# Patient Record
Sex: Male | Born: 1970 | Hispanic: Yes | Marital: Married | Smoking: Current every day smoker
Health system: Southern US, Community
[De-identification: ages and names within clinical notes are randomized; demographics above are authoritative.]

---

## 2012-03-27 ENCOUNTER — Emergency Department (HOSPITAL_BASED_OUTPATIENT_CLINIC_OR_DEPARTMENT_OTHER)
Admission: EM | Admit: 2012-03-27 | Discharge: 2012-03-27 | Disposition: A | Discharge status: Home / Self Care | Payer: Self-pay | Attending: Emergency Medicine | Admitting: Emergency Medicine

## 2012-03-27 ENCOUNTER — Encounter (HOSPITAL_BASED_OUTPATIENT_CLINIC_OR_DEPARTMENT_OTHER): Payer: Self-pay | Admitting: Emergency Medicine

## 2012-03-27 DIAGNOSIS — T672XXA Heat cramp, initial encounter: Secondary | ICD-10-CM

## 2012-03-27 DIAGNOSIS — Z880 Allergy status to penicillin: Secondary | ICD-10-CM | POA: Insufficient documentation

## 2012-03-27 DIAGNOSIS — X30XXXA Exposure to excessive natural heat, initial encounter: Secondary | ICD-10-CM | POA: Insufficient documentation

## 2012-03-27 DIAGNOSIS — F17200 Nicotine dependence, unspecified, uncomplicated: Secondary | ICD-10-CM | POA: Insufficient documentation

## 2012-03-27 DIAGNOSIS — T675XXA Heat exhaustion, unspecified, initial encounter: Secondary | ICD-10-CM | POA: Insufficient documentation

## 2012-03-27 DIAGNOSIS — E860 Dehydration: Secondary | ICD-10-CM | POA: Insufficient documentation

## 2012-03-27 LAB — BASIC METABOLIC PANEL
BUN: 33 mg/dL — ABNORMAL HIGH (ref 6–23)
CO2: 21 mEq/L (ref 19–32)
Chloride: 96 mEq/L (ref 96–112)
Creatinine, Ser: 1.7 mg/dL — ABNORMAL HIGH (ref 0.50–1.35)
Glucose, Bld: 114 mg/dL — ABNORMAL HIGH (ref 70–99)
Potassium: 3.4 mEq/L — ABNORMAL LOW (ref 3.5–5.1)

## 2012-03-27 LAB — URINALYSIS, ROUTINE W REFLEX MICROSCOPIC
Glucose, UA: NEGATIVE mg/dL
Ketones, ur: 15 mg/dL — AB
Specific Gravity, Urine: 1.024 (ref 1.005–1.030)
pH: 5 (ref 5.0–8.0)

## 2012-03-27 LAB — CBC WITH DIFFERENTIAL/PLATELET
Basophils Relative: 0 % (ref 0–1)
Eosinophils Relative: 1 % (ref 0–5)
HCT: 45.9 % (ref 39.0–52.0)
Hemoglobin: 17.3 g/dL — ABNORMAL HIGH (ref 13.0–17.0)
Lymphs Abs: 1.8 10*3/uL (ref 0.7–4.0)
MCV: 82.6 fL (ref 78.0–100.0)
Monocytes Relative: 7 % (ref 3–12)
Neutro Abs: 10 10*3/uL — ABNORMAL HIGH (ref 1.7–7.7)
RBC: 5.56 MIL/uL (ref 4.22–5.81)
RDW: 12.5 % (ref 11.5–15.5)
WBC: 12.8 10*3/uL — ABNORMAL HIGH (ref 4.0–10.5)

## 2012-03-27 LAB — URINE MICROSCOPIC-ADD ON

## 2012-03-27 NOTE — ED Notes (Signed)
Pt "started feeling bad" about an hour ago while at work. Works outside.  C/O muscle cramps. Coworkers poured rubbing alcohol on him.  Has had about 6 bottles of water today.

## 2012-04-03 NOTE — ED Provider Notes (Signed)
History     CSN: [id number]  Arrival date & time 03/27/12  9528   First MD Initiated Contact with Patient 03/27/12 1913      Chief Complaint  Patient presents with  . Spasms    (Consider location/radiation/quality/duration/timing/severity/associated sxs/prior treatment) HPIWalter Love is a very 41 y.o. male who is currently working as a Music therapist. Patient says he was working outside today and heat and had the gradual onset of severe muscle cramps. Muscle cramps are located in his arms and legs, they were 10 out of 10 when he first presented, they're crampy pain, does not radiate, he had no associated dizziness, lightheadedness, chest pain, shortness of breath. He had his coworkers purport rubbing alcohol on him without any relief. He has had about 6 bottles of water today. He denies drinking any alcohol today or last night.  History reviewed. No pertinent past medical history.  History reviewed. No pertinent past surgical history.  No family history on file.  History  Substance Use Topics  . Smoking status: Current Every Day Smoker -- 0.5 packs/day  . Smokeless tobacco: Not on file  . Alcohol Use: No      Review of Systems At least 10pt or greater review of systems completed and are negative except where specified in the HPI.  Allergies  Penicillins  Home Medications  No current outpatient prescriptions on file.  BP 157/102  Pulse 76  Temp 97.8 F (36.6 C) (Rectal)  Resp 18  Ht 5\' 5"  (1.651 m)  Wt 170 lb (77.111 kg)  BMI 28.29 kg/m2  SpO2 98%  Physical Exam  Nursing notes reviewed.  Electronic medical record reviewed. VITAL SIGNS:   Filed Vitals:   03/27/12 1810 03/27/12 1812 03/27/12 1957  BP:  157/102   Pulse:  76   Temp:  98.1 F (36.7 C) 97.8 F (36.6 C)  TempSrc:  Oral Rectal  Resp:  18   Height:  5\' 5"  (1.651 m)   Weight:  170 lb (77.111 kg)   SpO2: 97% 98%    CONSTITUTIONAL: Awake, oriented, appears non-toxic HENT: Atraumatic,  normocephalic, oral mucosa pink and moist, airway patent. Nares patent without drainage. External ears normal. EYES: Conjunctiva clear, EOMI, PERRLA NECK: Trachea midline, non-tender, supple CARDIOVASCULAR: Normal heart rate, Normal rhythm, No murmurs, rubs, gallops PULMONARY/CHEST: Clear to auscultation, no rhonchi, wheezes, or rales. Symmetrical breath sounds. Non-tender. ABDOMINAL: Non-distended, soft, non-tender - no rebound or guarding.  BS normal. NEUROLOGIC: Non-focal, moving all four extremities, no gross sensory or motor deficits. EXTREMITIES: No clubbing, cyanosis, or edema SKIN: Warm, Dry, No erythema, No rash  ED Course  Procedures (including critical care time)  Labs Reviewed  CBC WITH DIFFERENTIAL - Abnormal; Notable for the following:    WBC 12.8 (*)     Hemoglobin 17.3 (*)     MCHC 37.7 (*)     Neutrophils Relative 78 (*)     Neutro Abs 10.0 (*)     All other components within normal limits  BASIC METABOLIC PANEL - Abnormal; Notable for the following:    Sodium 134 (*)     Potassium 3.4 (*)     Glucose, Bld 114 (*)     BUN 33 (*)     Creatinine, Ser 1.70 (*)     Calcium 10.8 (*)     GFR calc non Af Amer 49 (*)     GFR calc Af Amer 56 (*)     All other components within normal limits  URINALYSIS, ROUTINE W  REFLEX MICROSCOPIC - Abnormal; Notable for the following:    Color, Urine AMBER (*)  BIOCHEMICALS MAY BE AFFECTED BY COLOR   APPearance CLOUDY (*)     Hgb urine dipstick TRACE (*)     Bilirubin Urine SMALL (*)     Ketones, ur 15 (*)     Protein, ur 100 (*)     All other components within normal limits  URINE MICROSCOPIC-ADD ON - Abnormal; Notable for the following:    Squamous Epithelial / LPF FEW (*)     All other components within normal limits   No results found.   1. Heat exhaustion   2. Cramp, heat     MDM  Harry Love is a 41 y.o. male presenting with some mild heat cramps/heat exhaustion. Do not think the patient is at risk for  rhabdomyolysis - his urine showed a trace amount of hemoglobin on the dipstick however it also showed 0-2 red blood cells per high-power field. Because there were red blood cells seen I am not concerned with myoglobin extracellularly. Patient responded very well to intravenous fluids and feels much better at this point. He's asking to leave and go get something to eat. We'll finish out his last active IV fluids and discharged home in good condition. Patient is given extensive instructions to maintain hydration working outside especially when it is hot out. Patient understands and acknowledges instructions given and will followup as needed.  I think his laboratory results reflect mild dehydration and heat exhaustion with an elevated BUN and creatinine, 33 and 1.7 respectively, do not think his elevation in creatinine represents an intrinsic renal failure rather a prerenal condition.  I explained the diagnosis and have given explicit precautions to return to the ER including return of cramps, chest pain, shortness of breath, dizziness or any other new or worsening symptoms. The patient understands and accepts the medical plan as it's been dictated and I have answered their questions. Discharge instructions concerning home care and prescriptions have been given.  The patient is STABLE and is discharged to home in good condition.          Jones Skene, MD 04/03/12 1408

## 2013-12-05 ENCOUNTER — Ambulatory Visit: Payer: Self-pay

## 2014-06-03 ENCOUNTER — Emergency Department (HOSPITAL_COMMUNITY)
Admission: EM | Admit: 2014-06-03 | Discharge: 2014-06-03 | Disposition: A | Discharge status: Home / Self Care | Payer: Self-pay | Attending: Emergency Medicine | Admitting: Emergency Medicine

## 2014-06-03 ENCOUNTER — Emergency Department (HOSPITAL_COMMUNITY): Payer: Self-pay

## 2014-06-03 ENCOUNTER — Encounter (HOSPITAL_COMMUNITY): Payer: Self-pay | Admitting: *Deleted

## 2014-06-03 DIAGNOSIS — Z720 Tobacco use: Secondary | ICD-10-CM | POA: Insufficient documentation

## 2014-06-03 DIAGNOSIS — M25569 Pain in unspecified knee: Secondary | ICD-10-CM

## 2014-06-03 DIAGNOSIS — Z880 Allergy status to penicillin: Secondary | ICD-10-CM | POA: Insufficient documentation

## 2014-06-03 DIAGNOSIS — M25562 Pain in left knee: Secondary | ICD-10-CM | POA: Insufficient documentation

## 2014-06-03 MED ORDER — OXYCODONE-ACETAMINOPHEN 5-325 MG PO TABS
2.0000 | ORAL_TABLET | Freq: Once | ORAL | Status: AC
  Administered 2014-06-03: 2 via ORAL
  Filled 2014-06-03: qty 2

## 2014-06-03 MED ORDER — OXYCODONE-ACETAMINOPHEN 5-325 MG PO TABS: 2.0000 | ORAL_TABLET | Freq: Four times a day (QID) | ORAL | Status: AC | PRN

## 2014-06-03 NOTE — Discharge Instructions (Signed)

## 2014-06-03 NOTE — ED Provider Notes (Signed)
CSN: [id number]     Arrival date & time 06/03/14  1139 History  This chart was scribed for non-physician practitioner, Roxy Horsemanobert Junko Ohagan, PA-C working with Vanetta MuldersScott Zackowski, MD by Greggory StallionKayla Andersen, ED scribe. This patient was seen in room TR05C/TR05C and the patient's care was started at 12:45 PM.   Chief Complaint  Patient presents with  . Knee Pain   The history is provided by the patient. No language interpreter was used.    HPI Comments: Harry Love is a 43 y.o. male who presents to the Emergency Department complaining of left knee pain with associated swelling that started 3 days ago. Denies fall or injury. Pt states he gets similar pain every month but has never been evaluated for it. Bending his knee worsens pain. He has not taken anything yet. Pt does not have an orthopedist.   History reviewed. No pertinent past medical history. History reviewed. No pertinent past surgical history. History reviewed. No pertinent family history. History  Substance Use Topics  . Smoking status: Current Every Day Smoker -- 0.50 packs/day  . Smokeless tobacco: Not on file  . Alcohol Use: No    Review of Systems  Constitutional: Negative for fever.  HENT: Negative for congestion.   Eyes: Negative for redness.  Respiratory: Negative for shortness of breath.   Cardiovascular: Negative for chest pain.  Gastrointestinal: Negative for abdominal distention.  Musculoskeletal: Positive for joint swelling and arthralgias.  Skin: Negative for rash.  Neurological: Negative for speech difficulty.  Psychiatric/Behavioral: Negative for confusion.   Allergies  Penicillins  Home Medications   Prior to Admission medications   Not on File   BP 159/96 mmHg  Pulse 79  Temp(Src) 98 F (36.7 C) (Oral)  Resp 18  SpO2 97%   Physical Exam  Constitutional: He is oriented to person, place, and time. He appears well-developed. No distress.  HENT:  Head: Normocephalic and atraumatic.  Eyes: Conjunctivae  and EOM are normal.  Cardiovascular: Normal rate and regular rhythm.   Pulmonary/Chest: Effort normal. No stridor. No respiratory distress.  Abdominal: He exhibits no distension.  Musculoskeletal:  Left knee moderately swollen and tender to palpation over medial and lateral aspects. ROM limited secondary to pain. Pt is able to ambulate. No bony abnormality or deformity.   Neurological: He is alert and oriented to person, place, and time.  Skin: Skin is warm and dry.  Psychiatric: He has a normal mood and affect.  Nursing note and vitals reviewed.   ED Course  Procedures (including critical care time)  DIAGNOSTIC STUDIES: Oxygen Saturation is 97% on RA, normal by my interpretation.    COORDINATION OF CARE: 12:47 PM-Discussed treatment plan which includes knee xray with pt at bedside and pt agreed to plan.  2:00 PM-Consulted with Dr. Deretha EmoryZackowski. As there is no mechanism of injury, we feel orthopedic follow up is appropriate.   Labs Review Labs Reviewed - No data to display  Imaging Review Dg Knee Complete 4 Views Left  06/03/2014   CLINICAL DATA:  43 year old male with acute on chronic knee pain. Lateral and superior pain with swelling. No known injury. Initial encounter.  EXAM: LEFT KNEE - COMPLETE 4+ VIEW  COMPARISON:  None.  FINDINGS: Discontinuity of the superolateral patella suggesting bipartite patella. However, there is irregular spurring or perhaps mild periosteal reaction also at that level. Moderate to large suprapatellar joint effusion also in the region.  Elsewhere bone mineralization is normal. Mild medial and lateral compartment degenerative spurring. No acute fracture identified.  IMPRESSION:  1. 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 Query underlying quadriceps tendinopathy. 2. Moderate to large joint effusion.   Electronically Signed   By: Augusto GambleLee  Hall M.D.   On:  06/03/2014 13:48     EKG Interpretation None      MDM   Final diagnoses:  Knee pain    Patient with knee pain.  States that this is a chronic problem for him.  Will recommend ortho follow-up.  Discussed with Dr. Deretha EmoryZackowski.  No MOI.  I personally performed the services described in this documentation, which was scribed in my presence. The recorded information has been reviewed and is accurate.  Roxy Horsemanobert Shauni Henner, PA-C 06/03/14 1412  Vanetta MuldersScott Zackowski, MD 06/04/14 [id number]

## 2014-06-03 NOTE — ED Notes (Signed)
Pt reports left knee pain and swelling x 4 days. Denies injury to knee, ambulatory at triage.

## 2014-11-30 ENCOUNTER — Encounter (HOSPITAL_COMMUNITY): Payer: Self-pay | Admitting: Emergency Medicine

## 2014-11-30 ENCOUNTER — Emergency Department (HOSPITAL_COMMUNITY): Payer: Self-pay

## 2014-11-30 ENCOUNTER — Emergency Department (HOSPITAL_COMMUNITY)
Admission: EM | Admit: 2014-11-30 | Discharge: 2014-11-30 | Disposition: A | Discharge status: Home / Self Care | Payer: Self-pay | Attending: Emergency Medicine | Admitting: Emergency Medicine

## 2014-11-30 DIAGNOSIS — Z720 Tobacco use: Secondary | ICD-10-CM | POA: Insufficient documentation

## 2014-11-30 DIAGNOSIS — T1592XA Foreign body on external eye, part unspecified, left eye, initial encounter: Secondary | ICD-10-CM

## 2014-11-30 DIAGNOSIS — Z880 Allergy status to penicillin: Secondary | ICD-10-CM | POA: Insufficient documentation

## 2014-11-30 DIAGNOSIS — W228XXA Striking against or struck by other objects, initial encounter: Secondary | ICD-10-CM | POA: Insufficient documentation

## 2014-11-30 DIAGNOSIS — M25462 Effusion, left knee: Secondary | ICD-10-CM | POA: Insufficient documentation

## 2014-11-30 DIAGNOSIS — Y9389 Activity, other specified: Secondary | ICD-10-CM | POA: Insufficient documentation

## 2014-11-30 DIAGNOSIS — Y9289 Other specified places as the place of occurrence of the external cause: Secondary | ICD-10-CM | POA: Insufficient documentation

## 2014-11-30 DIAGNOSIS — T1512XA Foreign body in conjunctival sac, left eye, initial encounter: Secondary | ICD-10-CM | POA: Insufficient documentation

## 2014-11-30 DIAGNOSIS — Z23 Encounter for immunization: Secondary | ICD-10-CM | POA: Insufficient documentation

## 2014-11-30 DIAGNOSIS — Y990 Civilian activity done for income or pay: Secondary | ICD-10-CM | POA: Insufficient documentation

## 2014-11-30 MED ORDER — TETRACAINE HCL 0.5 % OP SOLN
1.0000 [drp] | Freq: Once | OPHTHALMIC | Status: AC
  Administered 2014-11-30: 1 [drp] via OPHTHALMIC
  Filled 2014-11-30: qty 2

## 2014-11-30 MED ORDER — FLUORESCEIN SODIUM 1 MG OP STRP
1.0000 | ORAL_STRIP | Freq: Once | OPHTHALMIC | Status: AC
  Administered 2014-11-30: 1 via OPHTHALMIC
  Filled 2014-11-30: qty 1

## 2014-11-30 MED ORDER — TETANUS-DIPHTH-ACELL PERTUSSIS 5-2.5-18.5 LF-MCG/0.5 IM SUSP
0.5000 mL | Freq: Once | INTRAMUSCULAR | Status: AC
  Administered 2014-11-30: 0.5 mL via INTRAMUSCULAR
  Filled 2014-11-30: qty 0.5

## 2014-11-30 NOTE — ED Notes (Signed)
Declined W/C at D/C and was escorted to lobby by RN. 

## 2014-11-30 NOTE — ED Notes (Signed)
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 C/o pain left eye.

## 2014-11-30 NOTE — Discharge Instructions (Signed)
Go to the [facility] now [address] [place]  [phone]  [phone]  Eye, Foreign Body The term foreign body refers to any object near, on the surface of or in the eye that should not be there. A foreign body may be a small speck of dirt or dust, a hair or eyelash, a splinter or any object. CAUSES  Foreign bodies can get in the eye by:  Flying pieces of something that was broken or destroyed (debris).  A sudden injury (trauma) to the eye. SYMPTOMS  Symptoms depend on what the foreign body is and where it is in the eye. The most common locations are:  On the inner surface of the upper or lower eyelids or on the covering of the white part of the eye (conjunctiva). Symptoms in this location are:  Irritating and painful, especially when blinking.  Feeling like something is in the eye.  On the surface of the clear covering on the front of the eye (cornea). A corneal foreign body has symptoms that:  Are painful and irritating since the cornea is very sensitive.  Form small "rust rings" around a metallic foreign body. Metallic foreign bodies stick more firmly to the surface of the cornea.  Inside the eyeball. Infection can happen fast and can be hard to treat with antibiotics. This is an extremely dangerous situation. Foreign bodies inside the eye can threaten vision. A person may even loose their eye. Foreign bodies inside the eye may cause:  Great pain.  Immediate loss of vision. DIAGNOSIS  Foreign bodies are found during an exam by an eye specialist. Those that are on the eyelids, conjunctiva or cornea are usually (but not always) easily found. When a foreign body is inside the eyeball, a cataract may form almost right away. This makes it hard for an ophthalmologist to find the foreign body. Special tests may be needed, including ultrasound testing, X-rays and CT scans. TREATMENT   Foreign bodies that are on the eyelids, conjunctiva or cornea are often  removed easily and painlessly.  If the foreign body has caused a scratch or abrasion of the cornea, antibiotic drops, ointments and/or a tight patch called a "pressure patch" may be needed. Follow-up exams will be needed for several days until the abrasion heals.  Surgery is needed right away if the foreign body is inside the eyeball. This is a medical emergency. An antibiotic therapy will likely be given to stop an infection. HOME CARE INSTRUCTIONS  The use of eye patches is not universal. Their use varies from state to state and from caregiver to caregiver. If an eye patch was applied:  Keep the eye patch on for as long as directed by your caregiver until the follow-up appointment.  Do not remove the patch to put in medications unless instructed to do so. When replacing the patch, retape it as it was before. Follow the same procedure if the patch becomes loose.  WARNING: Do not drive or operate machinery while the eye is patched. The ability to judge distances will be impaired.  Only take over-the-counter or prescription medicines for pain, discomfort or fever as directed by the caregiver. If no eye patch was applied:  Keep the eye closed as much as possible. Do not rub the eye.  Wear dark glasses as needed to protect the eyes from bright light.  Do not wear contact lenses until the eye feels normal again, or as instructed.  Wear protective eye covering if there is  a risk of eye injury. This is important when working with high speed tools.  Only take over-the-counter or prescription medicines for pain, discomfort or fever as directed by the caregiver. SEEK IMMEDIATE MEDICAL CARE IF:   Pain increases in the eye or the vision changes.  You or your child has problems with the eye patch.  The injury to the eye appears to be getting larger.  There is discharge from the injured eye.  Swelling and/or soreness (inflammation) develops around the affected eye.  You or your child has an  oral temperature above 102 F (38.9 C), not controlled by medicine.  Your baby is older than 3 months with a rectal temperature of 102 F (38.9 C) or higher.  Your baby is 86 months old or younger with a rectal temperature of 100.4 F (38 C) or higher. MAKE SURE YOU:   Understand these instructions.  Will watch your condition.  Will get help right away if you are not doing well or get worse. Document Released: 06/12/2005 Document Revised: 09/04/2011 Document Reviewed: 11/07/2012 [facility] Patient Information 2015 [place], [place]. This information is not intended to replace advice given to you by your health care provider. Make sure you discuss any questions you have with your health care provider.

## 2014-11-30 NOTE — ED Provider Notes (Signed)
CSN: [id number]     Arrival date & time 11/30/14  1307 History  This chart was scribed for Harry Buffalo, NP working with Linwood Dibbles, MD by Evon Slack, ED Scribe. This patient was seen in room TR04C/TR04C and the patient's care was started at 1:54 PM.    Chief Complaint  Patient presents with  . Eye Injury  . Knee Pain   Patient is a 44 y.o. male presenting with eye injury and knee pain. The history is provided by the patient. No language interpreter was used.  Eye Injury This is a new problem. The current episode started 6 to 12 hours ago. The problem occurs rarely. The problem has not changed since onset.Exacerbated by: blinking  Nothing relieves the symptoms. He has tried water for the symptoms.  Knee Pain Location:  Knee Time since incident:  2 days Knee location:  L knee Pain details:    Radiates to:  Does not radiate   Severity:  Mild   Onset quality:  Gradual   Duration:  2 days Relieved by:  None tried Worsened by:  Nothing tried Ineffective treatments:  None tried Associated symptoms: swelling   Associated symptoms: no numbness and no tingling    HPI Comments: Harry Love is a 44 y.o. male who presents to the Emergency Department complaining of left eye pain onset this morning. 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 Pt states that he was not wearing safety glasses. Pt states that he has tried rinsing the eye out with water with no relief. Pt states that the pain is worse when blinking.    Pt is also complaining of left knee pain with associated swelling for the past 2 days. pt denies injury  or fall. Pt states that the pain is worse with movement.   History reviewed. No pertinent past medical history. History reviewed. No pertinent past surgical history. No family history on file. History  Substance Use Topics  . Smoking status: Current Every Day Smoker -- 0.50 packs/day  . Smokeless tobacco: Not on file  .  Alcohol Use: No    Review of Systems  Eyes: Positive for pain.  Musculoskeletal: Positive for arthralgias.  Neurological: Negative for numbness.  All other systems reviewed and are negative.     Allergies  Penicillins  Home Medications   Prior to Admission medications   Medication Sig Start Date End Date Taking? Authorizing Provider  acetaminophen (TYLENOL) 325 MG tablet Take 650 mg by mouth every 6 (six) hours as needed for mild pain.   Yes Historical Provider, MD  oxyCODONE-acetaminophen (PERCOCET/ROXICET) 5-325 MG per tablet Take 2 tablets by mouth every 6 (six) hours as needed for severe pain. Patient not taking: Reported on 11/30/2014 06/03/14   Roxy Horseman, PA-C   BP 147/93 mmHg  Pulse 56  Temp(Src) 98 F (36.7 C) (Oral)  Resp 16  SpO2 98%   Physical Exam  Constitutional: He is oriented to person, place, and time. He appears well-developed and well-nourished. No distress.  HENT:  Head: Normocephalic and atraumatic.  Eyes: EOM are normal. Pupils are equal, round, and reactive to light. Left conjunctiva is injected.  Slit lamp exam:      The left eye shows foreign body. The left eye shows no corneal abrasion.    Foreign body left eye @ 11 o'clock  Neck: Neck supple. No tracheal deviation present.  Cardiovascular: Normal rate.   Pulmonary/Chest: Effort normal. No  respiratory distress.  Musculoskeletal: Normal range of motion. He exhibits tenderness.  Tenderness over medial and collateral ligament of left knee. Pedal pulses equal, adequate circulation.   Neurological: He is alert and oriented to person, place, and time.  Skin: Skin is warm and dry.  Psychiatric: He has a normal mood and affect. His behavior is normal.  Nursing note and vitals reviewed.   ED Course  Procedures (including critical care time) DIAGNOSTIC STUDIES: Oxygen Saturation is 100% on RA, normal by my interpretation.    COORDINATION OF CARE: 2:02 PM-Discussed treatment plan with pt at  bedside and pt agreed to plan.   Consult with opthalmology on call and they will see the patient in the office as soon as he is d/c from the ED.    Labs Review Labs Reviewed - No data to display  Imaging Review Dg Knee Complete 4 Views Left  11/30/2014   CLINICAL DATA:  Chronic left knee pain and swelling, worse over the last 2 days.  EXAM: LEFT KNEE - COMPLETE 4+ VIEW  COMPARISON:  11/19/2014  FINDINGS: No acute fracture or dislocation is identified. There is a new moderate sized knee joint effusion. Bipartite patella is again noted with superior patellar spurring. Very mild tricompartmental marginal osteophytosis is unchanged. No significant joint space narrowing is present.  IMPRESSION: New knee joint effusion.  No acute osseous abnormality.   Electronically Signed   By: Sebastian AcheAllen  Grady   On: 11/30/2014 14:34   Knee immobilizer, ice, elevate the left knee. F/u with ortho if symptoms persist.   MDM  44 y.o. male with foreign body to left eye that occurred while at work and left knee pain and swelling. Stable for d/c to go to see the ophthalmologist for further evaluation and removal of foreign body from eye. Discussed with the patient and all questioned fully answered.  Final diagnoses:  Foreign body, eye, left, initial encounter  Knee effusion, left   I personally performed the services described in this documentation, which was scribed in my presence. The recorded information has been reviewed and is accurate.      HillroseHope M Neese, NP 12/01/14 1328  Linwood DibblesJon Knapp, MD 12/02/14 2200

## 2018-09-08 ENCOUNTER — Other Ambulatory Visit: Payer: Self-pay

## 2018-09-08 ENCOUNTER — Emergency Department (HOSPITAL_COMMUNITY)
Admission: EM | Admit: 2018-09-08 | Discharge: 2018-09-09 | Disposition: A | Discharge status: Home / Self Care | Payer: Self-pay | Attending: Emergency Medicine | Admitting: Emergency Medicine

## 2018-09-08 DIAGNOSIS — R059 Cough, unspecified: Secondary | ICD-10-CM

## 2018-09-08 DIAGNOSIS — I10 Essential (primary) hypertension: Secondary | ICD-10-CM | POA: Insufficient documentation

## 2018-09-08 DIAGNOSIS — Z720 Tobacco use: Secondary | ICD-10-CM

## 2018-09-08 DIAGNOSIS — R05 Cough: Secondary | ICD-10-CM

## 2018-09-08 DIAGNOSIS — J441 Chronic obstructive pulmonary disease with (acute) exacerbation: Secondary | ICD-10-CM | POA: Insufficient documentation

## 2018-09-08 DIAGNOSIS — F17200 Nicotine dependence, unspecified, uncomplicated: Secondary | ICD-10-CM | POA: Insufficient documentation

## 2018-09-09 ENCOUNTER — Emergency Department (HOSPITAL_COMMUNITY): Payer: Self-pay

## 2018-09-09 MED ORDER — ALBUTEROL SULFATE HFA 108 (90 BASE) MCG/ACT IN AERS
2.0000 | INHALATION_SPRAY | Freq: Once | RESPIRATORY_TRACT | Status: AC
  Administered 2018-09-09: 2 via RESPIRATORY_TRACT
  Filled 2018-09-09: qty 6.7

## 2018-09-09 MED ORDER — PREDNISONE 20 MG PO TABS
40.0000 mg | ORAL_TABLET | Freq: Once | ORAL | Status: AC
  Administered 2018-09-09: 40 mg via ORAL
  Filled 2018-09-09: qty 2

## 2018-09-09 MED ORDER — PREDNISONE 20 MG PO TABS: 40.0000 mg | ORAL_TABLET | Freq: Every day | ORAL | 0 refills | Status: AC

## 2018-09-09 MED ORDER — ALBUTEROL SULFATE HFA 108 (90 BASE) MCG/ACT IN AERS: 2.0000 | INHALATION_SPRAY | RESPIRATORY_TRACT | 0 refills | Status: AC | PRN

## 2018-09-09 MED ORDER — AEROCHAMBER PLUS FLO-VU LARGE MISC
1.0000 | Freq: Once | Status: AC
  Administered 2018-09-09: 1

## 2018-09-09 MED ORDER — AZITHROMYCIN 250 MG PO TABS: 250.0000 mg | ORAL_TABLET | Freq: Every day | ORAL | 0 refills | Status: AC

## 2018-09-09 MED ORDER — AEROCHAMBER PLUS FLO-VU LARGE MISC
Status: AC
  Filled 2018-09-09: qty 1

## 2018-09-09 NOTE — ED Triage Notes (Signed)
Pt has had a cough for approx 1 week. Previously was febrile at home but this has since resolved. Pt has no known sick contacts or travel. Pt states that he needs a note to be able to return to work.

## 2018-09-09 NOTE — Discharge Instructions (Addendum)
You may have diarrhea from the antibiotics.  It is very important that you continue to take the antibiotics even if you get diarrhea unless a medical professional tells you that you may stop taking them.  If you stop too early the bacteria you are being treated for will become stronger and you may need different, more powerful antibiotics that have more side effects and worsening diarrhea.  Please stay well hydrated and consider probiotics as they may decrease the severity of your diarrhea.  Please be aware that if you take any hormonal contraception (birth control pills, nexplanon, the ring, etc) that your birth control will not work while you are taking antibiotics and you need to use back up protection as directed on the birth control medication information insert.   I have given you a prescription for steroids today.  Some common side effects include feelings of extra energy, feeling warm, increased appetite, and stomach upset.  If you are diabetic your sugars may run higher than usual.

## 2018-09-09 NOTE — ED Provider Notes (Signed)
[facility] Provider Note   CSN: [id number] Arrival date & time: 09/08/18  2347    History   Chief Complaint Chief Complaint  Patient presents with  . Cough    HPI Oswaldo DoneWalter Arment is a 48 y.o. male  With a past medical history of tobacco abuse, who presents today for evaluation of cough and shortness of breath.  He reports that for the past week he has had cough.  He has been coughing up yellow sputum.  When this started last week he had a fever, he denies cold-like symptoms at that time however reports body aches.  He reports his fever has fully resolved however his cough is keeping him up at night.  He states that his work sent him here telling him that he needs papers if he is going to continue to work.  He reports that he has smoked half a pack a day for many years, does not normally have a cough.  He currently denies nasal congestion.  Reports occasional sore throat from the coughing, denies any sick contacts or travel.  No known contact with anyone with coronavirus.    HPI  No past medical history on file.  There are no active problems to display for this patient.   No past surgical history on file.      Home Medications    Prior to Admission medications   Medication Sig Start Date End Date Taking? Authorizing Provider  acetaminophen (TYLENOL) 325 MG tablet Take 650 mg by mouth every 6 (six) hours as needed for mild pain.    [provider]  albuterol (PROVENTIL HFA;VENTOLIN HFA) 108 (90 Base) MCG/ACT inhaler Inhale 2 puffs into the lungs every 4 (four) hours as needed for wheezing or shortness of breath. 09/09/18   Cristina GongHammond, Shanyah Gattuso W, PA-C  azithromycin (ZITHROMAX) 250 MG tablet Take 1 tablet (250 mg total) by mouth daily. Take first 2 tablets together, then 1 every day until finished. 09/09/18   Cristina GongHammond, Lara Palinkas W, PA-C  oxyCODONE-acetaminophen (PERCOCET/ROXICET) 5-325 MG per tablet Take 2 tablets by mouth every 6  (six) hours as needed for severe pain. Patient not taking: Reported on 11/30/2014 06/03/14   Roxy HorsemanBrowning, Robert, PA-C  predniSONE (DELTASONE) 20 MG tablet Take 2 tablets (40 mg total) by mouth daily for 4 days. 09/09/18 09/13/18  Cristina GongHammond, Tlaloc Taddei W, PA-C    Family History No family history on file.  Social History Social History   Tobacco Use  . Smoking status: Current Every Day Smoker    Packs/day: 0.50  Substance Use Topics  . Alcohol use: No  . Drug use: No     Allergies   Penicillins   Review of Systems Review of Systems  Constitutional: Positive for fever (Subjective, last week, has resolved. ). Negative for chills.  HENT: Positive for sore throat. Negative for congestion, ear pain, facial swelling, rhinorrhea, sinus pressure and sinus pain.   Respiratory: Positive for cough and shortness of breath.   Cardiovascular: Negative for chest pain and leg swelling.  Gastrointestinal: Negative for abdominal pain, diarrhea, nausea and vomiting.  Musculoskeletal: Negative for back pain and myalgias.  Neurological: Negative for headaches.  Psychiatric/Behavioral: Negative for confusion.  All other systems reviewed and are negative.    Physical Exam Updated Vital Signs BP (!) 157/107   Pulse 80   Temp 98.7 F (37.1 C) (Oral)   Resp (!) 23   Ht 5\' 5"  (1.651 m)   Wt 72.6 kg   SpO2 99%  BMI 26.63 kg/m   Physical Exam Vitals signs and nursing note reviewed.  Constitutional:      General: He is not in acute distress.    Appearance: He is well-developed.  HENT:     Head: Normocephalic and atraumatic.     Right Ear: External ear normal. There is impacted cerumen.     Left Ear: Tympanic membrane, ear canal and external ear normal.     Nose: Nose normal. No congestion.     Mouth/Throat:     Mouth: Mucous membranes are moist.     Pharynx: No oropharyngeal exudate or posterior oropharyngeal erythema.  Eyes:     Conjunctiva/sclera: Conjunctivae normal.  Neck:      Musculoskeletal: Normal range of motion and neck supple. No neck rigidity.  Cardiovascular:     Rate and Rhythm: Normal rate and regular rhythm.     Pulses: Normal pulses.     Heart sounds: Normal heart sounds. No murmur.  Pulmonary:     Effort: Pulmonary effort is normal. No respiratory distress.     Comments: Decreased breath sounds bilaterally.  Abdominal:     General: There is no distension.     Palpations: Abdomen is soft.     Tenderness: There is no abdominal tenderness.  Musculoskeletal: Normal range of motion.     Right lower leg: No edema.     Left lower leg: No edema.  Lymphadenopathy:     Cervical: No cervical adenopathy.  Skin:    General: Skin is warm and dry.  Neurological:     General: No focal deficit present.     Mental Status: He is alert and oriented to person, place, and time.  Psychiatric:        Mood and Affect: Mood normal.        Behavior: Behavior normal.      ED Treatments / Results  Labs (all labs ordered are listed, but only abnormal results are displayed) Labs Reviewed - No data to display  EKG EKG Interpretation  Date/Time:  Monday September 09 2018 00:15:40 EDT Ventricular Rate:  67 PR Interval:    QRS Duration: 97 QT Interval:  387 QTC Calculation: 409 R Axis:   49 Text Interpretation:  Sinus rhythm Borderline prolonged PR interval Borderline T wave abnormalities No significant change was found Confirmed by Glynn Octave [phone]) on 09/09/2018 2:46:37 AM   Radiology Dg Chest 2 View  Result Date: 09/09/2018 CLINICAL DATA:  Cough and shortness of breath EXAM: CHEST - 2 VIEW COMPARISON:  None. FINDINGS: The heart size and mediastinal contours are within normal limits. Both lungs are clear. The visualized skeletal structures are unremarkable. IMPRESSION: No active cardiopulmonary disease. Electronically Signed   By: Deatra Robinson M.D.   On: 09/09/2018 00:30    Procedures Procedures (including critical care time)  Medications Ordered in  ED Medications  AeroChamber Plus Flo-Vu Large MISC (has no administration in time range)  albuterol (PROVENTIL HFA;VENTOLIN HFA) 108 (90 Base) MCG/ACT inhaler 2 puff (2 puffs Inhalation Given 09/09/18 0122)  AeroChamber Plus Flo-Vu Large MISC 1 each (1 each Other Given 09/09/18 0122)  predniSONE (DELTASONE) tablet 40 mg (40 mg Oral Given 09/09/18 0301)     Initial Impression / Assessment and Plan / ED Course  I have reviewed the triage vital signs and the nursing notes.  Pertinent labs & imaging results that were available during my care of the patient were reviewed by me and considered in my medical decision making (see chart for details).  Benny Tumolo presents today for evaluation of productive cough for approximately 1 week.  He has a longstanding history of smoking however has never been formally diagnosed with COPD.  He is afebrile, not consistently tachycardic or tachypneic and satting at 100% on room air.  EKG was obtained due to reported shortness of breath/chest tightness without acute ischemic abnormalities.  Chest x-ray was obtained which did not show consolidation or evidence of pneumonia.  I discussed additional evaluation with the patient who declined that at this time.  He was given albuterol inhaler after which his lung sounds were significantly improved, he said he felt like his breathing was normal and wished to go home.  He is given p.o. prednisone in addition to antibiotics and steroids, as I suspect he has underlying COPD based on his smoking history.  He was informed of his high blood pressure and states the understanding.    Return precautions were discussed with patient who states their understanding.  At the time of discharge patient denied any unaddressed complaints or concerns.  Patient is agreeable for discharge home.   Final Clinical Impressions(s) / ED Diagnoses   Final diagnoses:  Cough  COPD exacerbation (HCC)  Tobacco abuse  Hypertension,  unspecified type    ED Discharge Orders         Ordered    albuterol (PROVENTIL HFA;VENTOLIN HFA) 108 (90 Base) MCG/ACT inhaler  Every 4 hours PRN     09/09/18 0240    predniSONE (DELTASONE) 20 MG tablet  Daily     09/09/18 0240    azithromycin (ZITHROMAX) 250 MG tablet  Daily     09/09/18 0240           Cristina Gong, PA-C 09/09/18 0601    Glynn Octave, MD 09/09/18 [phone]

## 2019-12-01 IMAGING — CR CHEST - 2 VIEW
2 series · 2 of 2 positions shown · non-contrast
Comparison: None.

CLINICAL DATA: Cough and shortness of breath

EXAM:
CHEST - 2 VIEW

[chest pa]
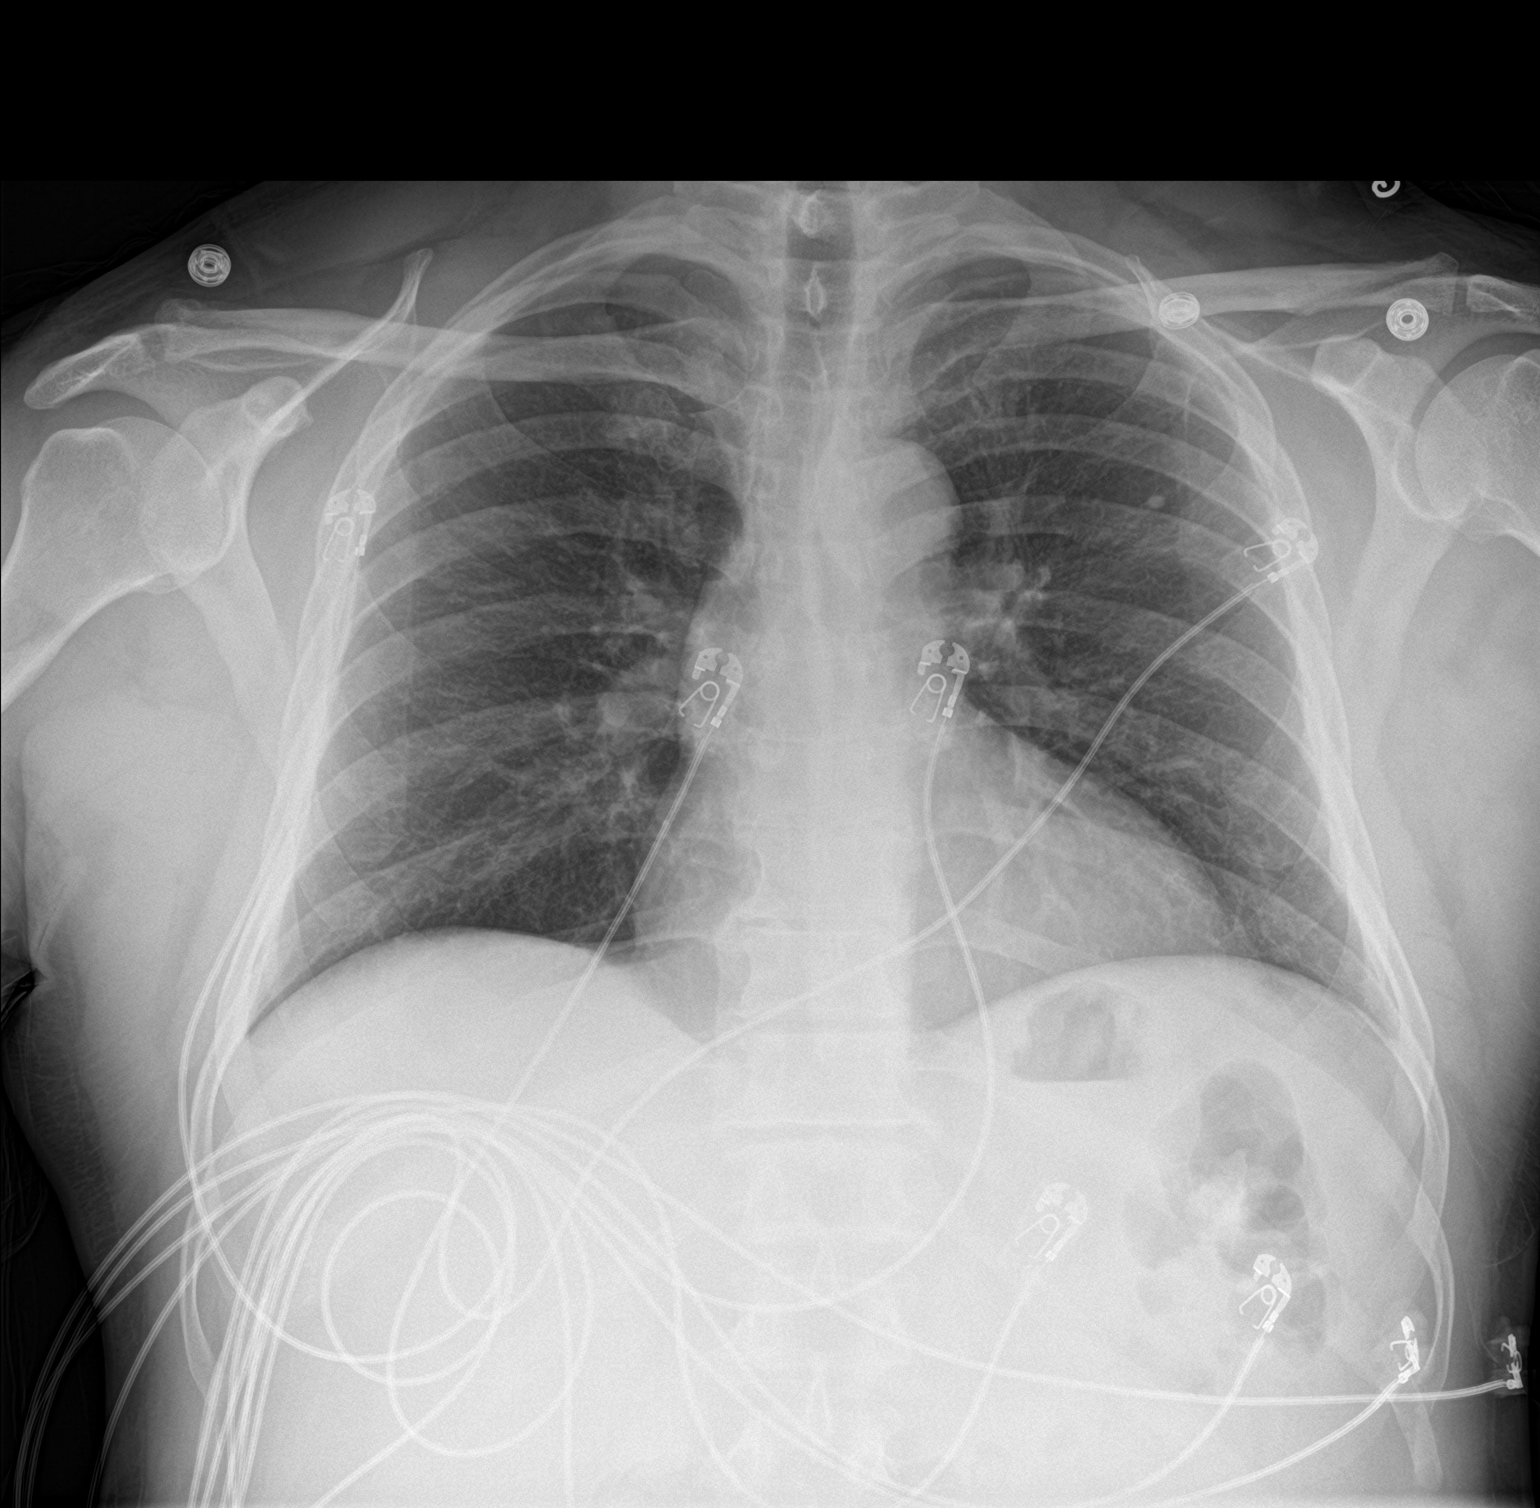

[chest lat]
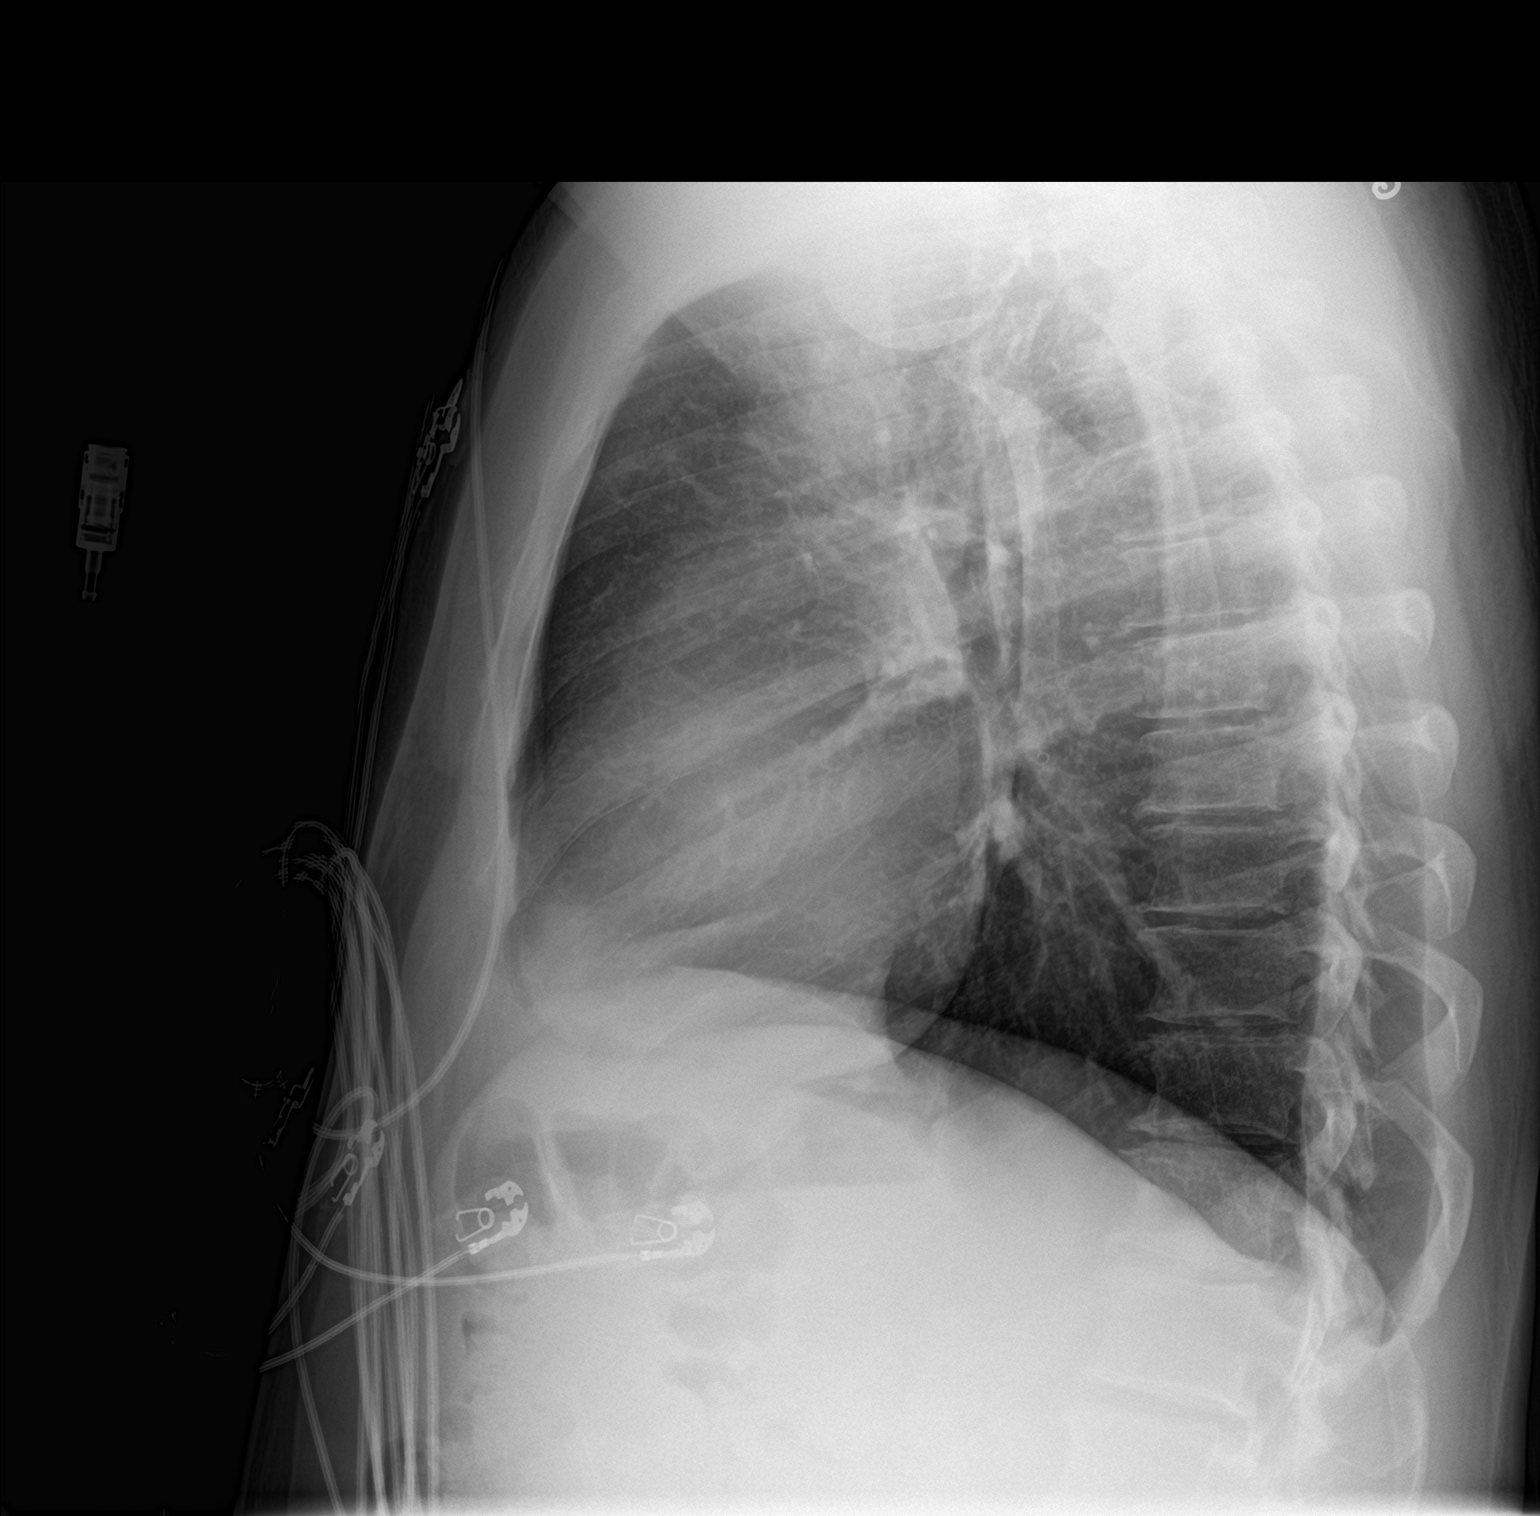

[2 of 2 positions shown; findings below may reference images not displayed]

FINDINGS: The heart size and mediastinal contours are within normal limits.
Both lungs are clear. The visualized skeletal structures are
unremarkable.
IMPRESSION: No active cardiopulmonary disease.

## 2023-07-01 ENCOUNTER — Other Ambulatory Visit: Payer: Self-pay

## 2023-07-01 ENCOUNTER — Emergency Department (HOSPITAL_COMMUNITY)
Admission: EM | Admit: 2023-07-01 | Discharge: 2023-07-01 | Disposition: A | Discharge status: Home / Self Care | Payer: BC Managed Care – PPO | Attending: Emergency Medicine | Admitting: Emergency Medicine

## 2023-07-01 ENCOUNTER — Encounter (HOSPITAL_COMMUNITY): Payer: Self-pay

## 2023-07-01 ENCOUNTER — Emergency Department (HOSPITAL_COMMUNITY): Payer: BC Managed Care – PPO

## 2023-07-01 DIAGNOSIS — M7989 Other specified soft tissue disorders: Secondary | ICD-10-CM | POA: Insufficient documentation

## 2023-07-01 DIAGNOSIS — R2242 Localized swelling, mass and lump, left lower limb: Secondary | ICD-10-CM

## 2023-07-01 LAB — I-STAT CG4 LACTIC ACID, ED: Lactic Acid, Venous: 1.2 mmol/L (ref 0.5–1.9)

## 2023-07-01 LAB — CBC WITH DIFFERENTIAL/PLATELET
Abs Immature Granulocytes: 0.07 10*3/uL (ref 0.00–0.07)
Basophils Absolute: 0 10*3/uL (ref 0.0–0.1)
Basophils Relative: 0 %
Eosinophils Absolute: 0.3 10*3/uL (ref 0.0–0.5)
Eosinophils Relative: 2 %
HCT: 45.5 % (ref 39.0–52.0)
Hemoglobin: 16 g/dL (ref 13.0–17.0)
Immature Granulocytes: 1 %
Lymphocytes Relative: 19 %
Lymphs Abs: 2.5 10*3/uL (ref 0.7–4.0)
MCH: 31.1 pg (ref 26.0–34.0)
MCHC: 35.2 g/dL (ref 30.0–36.0)
MCV: 88.3 fL (ref 80.0–100.0)
Monocytes Absolute: 1 10*3/uL (ref 0.1–1.0)
Monocytes Relative: 8 %
Neutro Abs: 9.4 10*3/uL — ABNORMAL HIGH (ref 1.7–7.7)
Neutrophils Relative %: 70 %
Platelets: 259 10*3/uL (ref 150–400)
RBC: 5.15 MIL/uL (ref 4.22–5.81)
RDW: 12.4 % (ref 11.5–15.5)
WBC: 13.4 10*3/uL — ABNORMAL HIGH (ref 4.0–10.5)
nRBC: 0 % (ref 0.0–0.2)

## 2023-07-01 LAB — COMPREHENSIVE METABOLIC PANEL
ALT: 27 U/L (ref 0–44)
AST: 24 U/L (ref 15–41)
Albumin: 3.9 g/dL (ref 3.5–5.0)
Alkaline Phosphatase: 62 U/L (ref 38–126)
Anion gap: 10 (ref 5–15)
BUN: 10 mg/dL (ref 6–20)
CO2: 23 mmol/L (ref 22–32)
Calcium: 10.2 mg/dL (ref 8.9–10.3)
Chloride: 104 mmol/L (ref 98–111)
Creatinine, Ser: 0.86 mg/dL (ref 0.61–1.24)
GFR, Estimated: >60 mL/min (ref >60)
Glucose, Bld: 118 mg/dL — ABNORMAL HIGH (ref 70–99)
Potassium: 3.7 mmol/L (ref 3.5–5.1)
Sodium: 137 mmol/L (ref 135–145)
Total Bilirubin: 0.8 mg/dL (ref 0.0–1.2)
Total Protein: 7.6 g/dL (ref 6.5–8.1)

## 2023-07-01 LAB — URIC ACID: Uric Acid, Serum: 7.9 mg/dL (ref 3.7–8.6)

## 2023-07-01 MED ORDER — HYDROCODONE-ACETAMINOPHEN 5-325 MG PO TABS
1.0000 | ORAL_TABLET | Freq: Once | ORAL | Status: AC
  Administered 2023-07-01: 1 via ORAL
  Filled 2023-07-01: qty 1

## 2023-07-01 MED ORDER — CEPHALEXIN 250 MG PO CAPS
500.0000 mg | ORAL_CAPSULE | Freq: Once | ORAL | Status: AC
  Administered 2023-07-01: 500 mg via ORAL
  Filled 2023-07-01: qty 2

## 2023-07-01 MED ORDER — INDOMETHACIN 50 MG PO CAPS: 50.0000 mg | ORAL_CAPSULE | Freq: Three times a day (TID) | ORAL | 0 refills | Status: AC

## 2023-07-01 MED ORDER — CEPHALEXIN 500 MG PO CAPS: 500.0000 mg | ORAL_CAPSULE | Freq: Four times a day (QID) | ORAL | 0 refills | Status: AC

## 2023-07-01 MED ORDER — INDOMETHACIN 25 MG PO CAPS
50.0000 mg | ORAL_CAPSULE | Freq: Once | ORAL | Status: AC
  Administered 2023-07-01: 50 mg via ORAL
  Filled 2023-07-01: qty 2

## 2023-07-01 NOTE — ED Notes (Signed)
 Pt given crackers and gingerale.

## 2023-07-01 NOTE — Discharge Instructions (Signed)
 Please begin taking indomethacin , 50 mg, 3 times a day for 5 days.  Please also begin taking Keflex , 500 mg, 4 times daily for 7 days.  Please follow-up with your primary care doctor.  Return to ED with any new or worsening symptoms.  Comience a tomar indometacina, 50 mg, 3 veces al [facility].  Tambin comience a tomar Keflex , 500 mg, 4 veces al [facility].  Por favor haga un seguimiento con su mdico de atencin primaria.  Regrese al servicio de urgencias si presenta algn sntoma nuevo o que empeore.

## 2023-07-01 NOTE — ED Provider Notes (Signed)
 [place] EMERGENCY DEPARTMENT AT [facility] Provider Note   CSN: [id number] Arrival date & time: 07/01/23  0058     History  Chief Complaint  Patient presents with   Leg Swelling    Harry Love is a 53 y.o. male with no documented medical history.  Patient presents to ED for evaluation of swelling and pain, redness to the dorsum of his left foot.  Also with significant redness and swelling to his first MTP of the left foot.  Reports that this has been ongoing for the last 4 days.  Reports history of the same in [place]  but cannot provide any information regarding treatment.  Denies fevers at home, nausea, vomiting, chest pain, shortness of breath or calf pain.  Denies history of blood clots.  Reports he has been taking diclofenac pills for pain which do help reduce pain significantly.  HPI     Home Medications Prior to Admission medications   Medication Sig Start Date End Date Taking? Authorizing Provider  cephALEXin  (KEFLEX ) 500 MG capsule Take 1 capsule (500 mg total) by mouth 4 (four) times daily. 07/01/23  Yes Ruthell Lonni FALCON, PA-C  indomethacin  (INDOCIN ) 50 MG capsule Take 1 capsule (50 mg total) by mouth 3 (three) times daily with meals. 07/01/23  Yes Ruthell Lonni FALCON, PA-C  acetaminophen  (TYLENOL ) 325 MG tablet Take 650 mg by mouth every 6 (six) hours as needed for mild pain.    [provider]  albuterol  (PROVENTIL  HFA;VENTOLIN  HFA) 108 (90 Base) MCG/ACT inhaler Inhale 2 puffs into the lungs every 4 (four) hours as needed for wheezing or shortness of breath. 09/09/18   Windle Almarie ORN, PA-C  azithromycin  (ZITHROMAX ) 250 MG tablet Take 1 tablet (250 mg total) by mouth daily. Take first 2 tablets together, then 1 every day until finished. 09/09/18   Windle Almarie ORN, PA-C  oxyCODONE -acetaminophen  (PERCOCET/ROXICET) 5-325 MG per tablet Take 2 tablets by mouth every 6 (six) hours as needed for severe pain. Patient not taking: Reported on  11/30/2014 06/03/14   Vicky Charleston, PA-C      Allergies    Penicillins    Review of Systems   Review of Systems  Constitutional:  Negative for fever.  Respiratory:  Negative for shortness of breath.   Cardiovascular:  Negative for chest pain.  Gastrointestinal:  Negative for nausea and vomiting.  Musculoskeletal:  Positive for arthralgias and myalgias.  Skin:  Positive for color change.  All other systems reviewed and are negative.   Physical Exam Updated Vital Signs BP (!) 188/102   Pulse 74   Temp 98.9 F (37.2 C) (Oral)   Resp 18   Ht 5' 5 (1.651 m)   Wt 72.6 kg   SpO2 95%   BMI 26.63 kg/m  Physical Exam Vitals and nursing note reviewed.  Constitutional:      General: He is not in acute distress.    Appearance: Normal appearance. He is not ill-appearing, toxic-appearing or diaphoretic.  HENT:     Head: Normocephalic and atraumatic.     Nose: Nose normal.     Mouth/Throat:     Mouth: Mucous membranes are moist.     Pharynx: Oropharynx is clear.  Eyes:     Extraocular Movements: Extraocular movements intact.     Conjunctiva/sclera: Conjunctivae normal.     Pupils: Pupils are equal, round, and reactive to light.  Cardiovascular:     Rate and Rhythm: Normal rate and regular rhythm.  Pulmonary:     Effort:  Pulmonary effort is normal.     Breath sounds: Normal breath sounds. No wheezing.  Abdominal:     General: Abdomen is flat. Bowel sounds are normal.     Palpations: Abdomen is soft.     Tenderness: There is no abdominal tenderness.  Musculoskeletal:     Cervical back: Normal range of motion and neck supple. No tenderness.     Comments: Swelling primarily to left first MCP, erythema  Skin:    General: Skin is warm and dry.     Capillary Refill: Capillary refill takes less than 2 seconds.     Comments: Erythema to dorsum of left foot.  Neurological:     Mental Status: He is alert and oriented to person, place, and time.     ED Results / Procedures /  Treatments   Labs (all labs ordered are listed, but only abnormal results are displayed) Labs Reviewed  COMPREHENSIVE METABOLIC PANEL - Abnormal; Notable for the following components:      Result Value   Glucose, Bld 118 (*)    All other components within normal limits  CBC WITH DIFFERENTIAL/PLATELET - Abnormal; Notable for the following components:   WBC 13.4 (*)    Neutro Abs 9.4 (*)    All other components within normal limits  URIC ACID  I-STAT CG4 LACTIC ACID, ED    EKG None  Radiology DG Foot Complete Left Result Date: 07/01/2023 CLINICAL DATA:  Swelling and redness to the first MTP EXAM: LEFT FOOT - COMPLETE 3 VIEW COMPARISON:  12/14/2014 FINDINGS: There is no evidence of fracture or dislocation. First MTP joint space narrowing with spurring and chronic medial ossicle. Milder spurring at the first metatarsal head and first distal phalanx. A degree of hallux valgus. No soft tissue calcification or gas. IMPRESSION: 1. No acute osseous finding. 2. 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 Electronically Signed   By: Dorn Roulette M.D.   On: 07/01/2023 04:23    Procedures Procedures   Medications Ordered in ED Medications  HYDROcodone -acetaminophen  (NORCO/VICODIN) 5-325 MG per tablet 1 tablet (1 tablet Oral Given 07/01/23 0404)  indomethacin  (INDOCIN ) capsule 50 mg (50 mg Oral Given 07/01/23 0404)  cephALEXin  (KEFLEX ) capsule 500 mg (500 mg Oral Given 07/01/23 0404)    ED Course/ Medical Decision Making/ A&P   Medical Decision Making Amount and/or Complexity of Data Reviewed Labs: ordered. Radiology: ordered.  Risk Prescription drug management.   53 year old male presents for evaluation of left foot swelling, redness.  Please see HPI for further details.  On exam patient has swelling prelabor his left first MTP with erythema as well as erythema to the dorsum of his left foot along with swelling.  Differential diagnosis includes gout versus cellulitis.  Will collect  labs, x-ray imaging.  CBC with leukocytosis of 13.4, no anemia.  CMP without electrolyte derangement.  Uric acid WNL at 7.9.  Lactic acid not elevated 1.2.  X-ray imaging shows arthritis primarily at the first MTP of the left foot.  Soft tissue swelling also noted.  At this time, unclear if gout versus cellulitis.  Will send patient home with Keflex  as well as indomethacin .  Have counseled him on dietary restrictions.  Have advised him to follow-up with his PCP.  He is stable to discharge home.   Final Clinical Impression(s) / ED Diagnoses Final diagnoses:  Localized swelling of left foot    Rx / DC Orders ED Discharge Orders          Ordered  cephALEXin  (KEFLEX ) 500 MG capsule  4 times daily        07/01/23 0602    indomethacin  (INDOCIN ) 50 MG capsule  3 times daily with meals        07/01/23 0602              Ruthell Lonni FALCON, PA-C 07/01/23 0603    Elnor Jayson LABOR, DO 07/01/23 [phone]

## 2023-07-01 NOTE — ED Triage Notes (Addendum)
 Pt reports L heel pain with swelling and redness on the top of the foot x4 days. Pt denies injury. Pt has been takin diclofenac with little relief.
